# Patient Record
Sex: Male | Born: 1995 | Race: Black or African American | Hispanic: No | Marital: Single | Smoking: Current every day smoker
Health system: Southern US, Community
[De-identification: ages and names within clinical notes are randomized; demographics above are authoritative.]

## PROBLEM LIST (undated history)

## (undated) ENCOUNTER — Ambulatory Visit (HOSPITAL_COMMUNITY): Source: Home / Self Care

---

## 2018-05-07 ENCOUNTER — Encounter (HOSPITAL_COMMUNITY): Payer: Self-pay

## 2018-05-07 ENCOUNTER — Emergency Department (HOSPITAL_COMMUNITY): Payer: Medicaid Other

## 2018-05-07 ENCOUNTER — Emergency Department (HOSPITAL_COMMUNITY)
Admission: EM | Admit: 2018-05-07 | Discharge: 2018-05-07 | Disposition: A | Discharge status: Home / Self Care | Payer: Medicaid Other | Attending: Emergency Medicine | Admitting: Emergency Medicine

## 2018-05-07 DIAGNOSIS — M79602 Pain in left arm: Secondary | ICD-10-CM | POA: Diagnosis present

## 2018-05-07 DIAGNOSIS — F17210 Nicotine dependence, cigarettes, uncomplicated: Secondary | ICD-10-CM | POA: Insufficient documentation

## 2018-05-07 MED ORDER — ONDANSETRON 4 MG PO TBDP
4.0000 mg | ORAL_TABLET | Freq: Once | ORAL | Status: AC
  Administered 2018-05-07: 4 mg via ORAL
  Filled 2018-05-07: qty 1

## 2018-05-07 MED ORDER — OXYCODONE-ACETAMINOPHEN 5-325 MG PO TABS
1.0000 | ORAL_TABLET | Freq: Once | ORAL | Status: AC
  Administered 2018-05-07: 1 via ORAL
  Filled 2018-05-07: qty 1

## 2018-05-07 NOTE — ED Notes (Signed)
Patient is A&Ox4.  No signs of distress noted.  Please see providers complete history and physical exam.  

## 2018-05-07 NOTE — ED Notes (Signed)
PT states understanding of care given, follow up care, and medication prescribed. PT ambulated from ED to car with a steady gait. 
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
## 2018-05-07 NOTE — ED Notes (Signed)
No deformity noted on exam.
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
## 2019-04-12 ENCOUNTER — Other Ambulatory Visit: Payer: Self-pay | Admitting: *Deleted

## 2019-04-12 DIAGNOSIS — Z20822 Contact with and (suspected) exposure to covid-19: Secondary | ICD-10-CM

## 2019-04-13 LAB — NOVEL CORONAVIRUS, NAA: SARS-CoV-2, NAA: NOT DETECTED

## 2019-07-22 ENCOUNTER — Encounter (HOSPITAL_COMMUNITY): Payer: Self-pay

## 2019-07-22 ENCOUNTER — Emergency Department (HOSPITAL_COMMUNITY)
Admission: EM | Admit: 2019-07-22 | Discharge: 2019-07-22 | Discharge status: Against Medical Advice | Payer: Medicaid Other | Attending: Emergency Medicine | Admitting: Emergency Medicine

## 2019-07-22 ENCOUNTER — Other Ambulatory Visit: Payer: Self-pay

## 2019-07-22 DIAGNOSIS — R110 Nausea: Secondary | ICD-10-CM | POA: Diagnosis present

## 2019-07-22 DIAGNOSIS — Z5321 Procedure and treatment not carried out due to patient leaving prior to being seen by health care provider: Secondary | ICD-10-CM | POA: Diagnosis not present

## 2019-07-22 MED ORDER — ONDANSETRON 4 MG PO TBDP
4.0000 mg | ORAL_TABLET | Freq: Once | ORAL | Status: AC | PRN
  Administered 2019-07-22: 4 mg via ORAL
  Filled 2019-07-22: qty 1

## 2019-07-22 NOTE — ED Notes (Signed)
Pt now vomiting in triage ?

## 2019-07-22 NOTE — ED Triage Notes (Signed)
Pt reports cough and running nose for the pat 2 days. Pt also c.o nausea after taking an antibiotic for a UTI. Denies abd pain or vomiting.

## 2020-11-22 ENCOUNTER — Encounter (HOSPITAL_COMMUNITY): Payer: Self-pay

## 2020-11-22 ENCOUNTER — Emergency Department (HOSPITAL_COMMUNITY)
Admission: EM | Admit: 2020-11-22 | Discharge: 2020-11-22 | Disposition: A | Discharge status: Home / Self Care | Payer: Medicaid Other | Attending: Emergency Medicine | Admitting: Emergency Medicine

## 2020-11-22 DIAGNOSIS — H5789 Other specified disorders of eye and adnexa: Secondary | ICD-10-CM | POA: Diagnosis present

## 2020-11-22 DIAGNOSIS — F17200 Nicotine dependence, unspecified, uncomplicated: Secondary | ICD-10-CM | POA: Insufficient documentation

## 2020-11-22 DIAGNOSIS — H1032 Unspecified acute conjunctivitis, left eye: Secondary | ICD-10-CM | POA: Diagnosis not present

## 2020-11-22 MED ORDER — BACITRACIN-POLYMYXIN B 500-10000 UNIT/GM OP OINT: 1.0000 "application " | TOPICAL_OINTMENT | Freq: Two times a day (BID) | OPHTHALMIC | 0 refills | Status: DC

## 2020-11-22 MED ORDER — BACITRACIN-POLYMYXIN B 500-10000 UNIT/GM OP OINT: 1.0000 "application " | TOPICAL_OINTMENT | Freq: Two times a day (BID) | OPHTHALMIC | 0 refills | Status: AC

## 2020-11-22 NOTE — ED Provider Notes (Signed)
[facility]-EMERGENCY DEPT Provider Note   CSN: [id number] Arrival date & time: 11/22/20  1213     History Chief Complaint  Patient presents with  . Eye Problem    Devin Thompson is a 25 y.o. male with no PMH that presents to the ER for eye problem. States that has had L eye swelling for three days, with some redness in eye and pain in eye. No foreign body sensation. Denies any vision changes or pain with EOMS. No HA, fevers, neck pain, confusion, cough, URI symptoms. .States that he has discharge coming from L eye, especially when he wakes up. Daughter at home with same thing. Does not wear contacts.  HPI     History reviewed. No pertinent past medical history.  There are no problems to display for this patient.   History reviewed. No pertinent surgical history.     History reviewed. No pertinent family history.  Social History   Tobacco Use  . Smoking status: Current Every Day Smoker  . Smokeless tobacco: Never Used  Substance Use Topics  . Alcohol use: Never  . Drug use: Never    Home Medications Prior to Admission medications   Medication Sig Start Date End Date Taking? Authorizing Provider  bacitracin-polymyxin b (POLYSPORIN) ophthalmic ointment Place 1 application into the left eye every 12 (twelve) hours for 7 days. Apply ophthalmically 1 drop every 3 or 4 hours for 7 to 10 days. 11/22/20 11/29/20 Yes Farrel Gordon, PA-C    Allergies    Patient has no known allergies.  Review of Systems   Review of Systems  Constitutional: Negative for diaphoresis, fatigue and fever.  HENT: Negative for congestion, dental problem, drooling, ear discharge, ear pain, rhinorrhea, sinus pressure and sinus pain.   Eyes: Positive for pain, discharge and redness. Negative for photophobia, itching and visual disturbance.  Respiratory: Negative for shortness of breath.   Cardiovascular: Negative for chest pain.  Gastrointestinal: Negative for nausea and  vomiting.  Musculoskeletal: Negative for back pain and myalgias.  Skin: Negative for color change, pallor, rash and wound.  Neurological: Negative for syncope, weakness, light-headedness, numbness and headaches.  Psychiatric/Behavioral: Negative for behavioral problems and confusion.    Physical Exam Updated Vital Signs BP 115/90 (BP Location: Left Arm)   Pulse 61   Temp 98.2 F (36.8 C) (Oral)   Resp 17   Ht 5\' 5"  (1.651 m)   Wt 56.7 kg   SpO2 99%   BMI 20.80 kg/m   Physical Exam Constitutional:      General: He is not in acute distress.    Appearance: Normal appearance. He is not ill-appearing, toxic-appearing or diaphoretic.  Eyes:     General: Lids are normal. Vision grossly intact.     Extraocular Movements: Extraocular movements intact.     Right eye: Normal extraocular motion.     Left eye: Normal extraocular motion.     Conjunctiva/sclera:     Right eye: Right conjunctiva is not injected. No exudate.    Left eye: Left conjunctiva is injected. Exudate present.     Pupils: Pupils are equal, round, and reactive to light.     Comments: L eye with some conjunctival injection with crusty discharge. Upper eyelid swollen. Normal EOMS, PERRLA. Nonpainful EOMS. Normal visual acuity.  Cardiovascular:     Rate and Rhythm: Normal rate and regular rhythm.     Pulses: Normal pulses.  Pulmonary:     Effort: Pulmonary effort is normal.     Breath  sounds: Normal breath sounds.  Musculoskeletal:        General: Normal range of motion.  Skin:    General: Skin is warm and dry.     Capillary Refill: Capillary refill takes less than 2 seconds.  Neurological:     General: No focal deficit present.     Mental Status: He is alert and oriented to person, place, and time.  Psychiatric:        Mood and Affect: Mood normal.        Behavior: Behavior normal.        Thought Content: Thought content normal.     ED Results / Procedures / Treatments   Labs (all labs ordered are listed,  but only abnormal results are displayed) Labs Reviewed - No data to display  EKG None  Radiology No results found.  Procedures Procedures   Medications Ordered in ED Medications - No data to display  ED Course  I have reviewed the triage vital signs and the nursing notes.  Pertinent labs & imaging results that were available during my care of the patient were reviewed by me and considered in my medical decision making (see chart for details).    MDM Rules/Calculators/A&P                          Pt with conjunctivitis, normal visual acuity. Daughter with same. Will treat, symptomatic treatment discussed, if symptoms persist pt will follow up with optho. Doesn't wear contacts.   Doubt need for further emergent work up at this time. I explained the diagnosis and have given explicit precautions to return to the ER including for any other new or worsening symptoms. The patient understands and accepts the medical plan as it's been dictated and I have answered their questions. Discharge instructions concerning home care and prescriptions have been given. The patient is STABLE and is discharged to home in good condition.   Final Clinical Impression(s) / ED Diagnoses Final diagnoses:  Acute conjunctivitis of left eye, unspecified acute conjunctivitis type    Rx / DC Orders ED Discharge Orders         Ordered    bacitracin-polymyxin b (POLYSPORIN) ophthalmic ointment  Every 12 hours        11/22/20 1613           Farrel Gordon, PA-C 11/22/20 1621    Melene Plan, DO 11/22/20 1743

## 2020-11-22 NOTE — Discharge Instructions (Addendum)
  You were evaluated in the Emergency Department and after careful evaluation, we did not find any emergent condition requiring admission or further testing in the hospital.   Your exam/testing today was overall reassuring.  Symptoms seem to be due to  conjunctivitis. Take antibiotics, follow up with eye doctor if needed. Please return to the Emergency Department if you experience any worsening of your condition.  Thank you for allowing [place] to be a part of your care. Please speak to your pharmacist about any new medications prescribed today in regards to side effects or interactions with other medications.

## 2020-11-22 NOTE — ED Triage Notes (Signed)
Pt arrived via POV, c/o left sided eye swelling x3 days. States daughter had same issue, told it was allergies and given eye drops. Pt states he tried eye drops with no relief.

## 2020-11-22 NOTE — ED Notes (Signed)
Left: 20/25 Right: 20/30 Both: 20/20

## 2020-11-22 NOTE — ED Triage Notes (Signed)
Emergency Medicine Provider Triage Evaluation Note  Devin Thompson , a 25 y.o. male  was evaluated in triage.  Pt complains of L side eye swelling for 3 days, same thing with daughter at home. Used eye drops without relief. Some discharge from eye.,   Review of Systems  Positive: Eye pain, swelling  Negative: Vision changes   Physical Exam  BP 115/90 (BP Location: Left Arm)   Pulse 61   Temp 98.2 F (36.8 C) (Oral)   Resp 17   Ht 5\' 5"  (1.651 m)   Wt 56.7 kg   SpO2 99%   BMI 20.80 kg/m  Gen:   Awake, no distress   HEENT:  L eyelid swollen Resp:  Normal effort  Cardiac:  Normal rate  Abd:   Nondistended, nontender  MSK:   Moves extremities without difficulty Neuro:  Speech clear  Medical Decision Making  Medically screening exam initiated at 12:46 PM.  Appropriate orders placed.  Devin Thompson was informed that the remainder of the evaluation will be completed by another provider, this initial triage assessment does not replace that evaluation, and the importance of remaining in the ED until their evaluation is complete.  Clinical Impression  MSE was initiated and I personally evaluated the patient and placed orders (if any) at  12:47 PM on November 22, 2020.  The patient appears stable so that the remainder of the MSE may be completed by another provider.    November 24, 2020, PA-C 11/22/20 1247

## 2022-07-02 ENCOUNTER — Ambulatory Visit (HOSPITAL_COMMUNITY)
Admission: EM | Admit: 2022-07-02 | Discharge: 2022-07-02 | Disposition: A | Discharge status: Home / Self Care | Payer: Medicaid Other | Attending: Internal Medicine | Admitting: Internal Medicine

## 2022-07-02 ENCOUNTER — Encounter (HOSPITAL_COMMUNITY): Payer: Self-pay

## 2022-07-02 DIAGNOSIS — F17210 Nicotine dependence, cigarettes, uncomplicated: Secondary | ICD-10-CM | POA: Diagnosis not present

## 2022-07-02 DIAGNOSIS — R059 Cough, unspecified: Secondary | ICD-10-CM | POA: Diagnosis present

## 2022-07-02 DIAGNOSIS — Z28310 Unvaccinated for covid-19: Secondary | ICD-10-CM | POA: Insufficient documentation

## 2022-07-02 DIAGNOSIS — Z20822 Contact with and (suspected) exposure to covid-19: Secondary | ICD-10-CM | POA: Diagnosis not present

## 2022-07-02 DIAGNOSIS — J069 Acute upper respiratory infection, unspecified: Secondary | ICD-10-CM

## 2022-07-02 MED ORDER — IBUPROFEN 800 MG PO TABS
800.0000 mg | ORAL_TABLET | Freq: Once | ORAL | Status: AC
  Administered 2022-07-02: 800 mg via ORAL

## 2022-07-02 MED ORDER — IBUPROFEN 800 MG PO TABS
ORAL_TABLET | ORAL | Status: AC
  Filled 2022-07-02: qty 1

## 2022-07-02 MED ORDER — PROMETHAZINE-DM 6.25-15 MG/5ML PO SYRP: 5.0000 mL | ORAL_SOLUTION | Freq: Every evening | ORAL | 0 refills | Status: DC | PRN

## 2022-07-02 MED ORDER — BENZONATATE 100 MG PO CAPS: 100.0000 mg | ORAL_CAPSULE | Freq: Three times a day (TID) | ORAL | 0 refills | Status: DC

## 2022-07-02 NOTE — Discharge Instructions (Signed)
You have a viral upper respiratory infection.  COVID-19 testing is pending. We will call you with results if positive. If your COVID test is positive, you must stay at home until day 6 of symptoms. On day 6, you may go out into public and go back to work, but you must wear a mask until day 11 of symptoms to prevent spread to others.  Purchase mucinex (guaifenesin) 1200mg and take this every 12 hours for the next few days to thin your nasal congestion and mucous so that you are able to get out of your body easier by coughing and blowing your nose. Drink plenty of water while taking this.  Take tessalon pearles every 8 hours as needed for cough.  Take Promethazine DM cough medication to help with your cough at nighttime so that you are able to sleep. Do not drive, drink alcohol, or go to work while taking this medication since it can make you sleepy. Only take this at nighttime.   You may take tylenol 1,000mg and ibuprofen 600mg every 6 hours with food as needed for fever/chills, sore throat, aches/pains, and inflammation associated with viral illness. Take this with food to avoid stomach upset.    You may do salt water and baking soda gargles every 4 hours as needed for your throat pain.  Please put 1 teaspoon of salt and 1/2 teaspoon of baking soda in 8 ounces of warm water then gargle and spit the water out. You may also put 1 tablespoon of honey in warm water and drink this to soothe your throat.  Place a humidifier in your room at night to help decrease dry air that can irritate your airway and cause you to have a sore throat and cough.  Please try to eat a well-balanced diet while you are sick so that your body gets proper nutrition to heal.  If you develop any new or worsening symptoms, please return.  If your symptoms are severe, please go to the emergency room.  Follow-up with your primary care provider for further evaluation and management of your symptoms as well as ongoing wellness visits.  I  hope you feel better!   

## 2022-07-02 NOTE — ED Provider Notes (Signed)
[facility]    CSN: [id number] Arrival date & time: 07/02/22  1241      History   Chief Complaint Chief Complaint  Patient presents with   URI    HPI Devin Thompson is a 26 y.o. male.   Patient presents urgent care for evaluation of cough, fever/chills, sore throat, and nasal congestion that starte 4 days ago on Friday, June 28, 2022.  States his whole household is sick including his wife and his 2 children.  He shares a room with his wife and 2 children and believes that they all have the same illness.  Symptoms initially began with some nausea and vomiting but he has not experienced nausea or vomiting since symptom onset.  Denies vision changes, headache, body aches, abdominal pain, shortness of breath, chest pain, weakness, dizziness, wheezing, history of chronic respiratory problems, and lightheadedness.  He is not vaccinated against COVID-19 or influenza.  He is a current every day cigarette and marijuana smoker.  He also vapes but denies other drug use.  He has been using over-the-counter combo cold and flu medications without very much relief of symptoms.  He has not taken his temperature at home but states he has felt hot and cold over the last few days.  Last dose of Tylenol or ibuprofen was yesterday.  He is currently afebrile.    URI   History reviewed. No pertinent past medical history.  There are no problems to display for this patient.   History reviewed. No pertinent surgical history.     Home Medications    Prior to Admission medications   Medication Sig Start Date End Date Taking? Authorizing Provider  benzonatate (TESSALON) 100 MG capsule Take 1 capsule (100 mg total) by mouth every 8 (eight) hours. 07/02/22  Yes Carlisle Beers, FNP  promethazine-dextromethorphan (PROMETHAZINE-DM) 6.25-15 MG/5ML syrup Take 5 mLs by mouth at bedtime as needed for cough. 07/02/22  Yes Carlisle Beers, FNP    Family History History reviewed. No  pertinent family history.  Social History Social History   Tobacco Use   Smoking status: Every Day   Smokeless tobacco: Never  Substance Use Topics   Alcohol use: Never   Drug use: Never     Allergies   Patient has no known allergies.   Review of Systems Review of Systems Per HPI  Physical Exam Triage Vital Signs ED Triage Vitals  Enc Vitals Group     BP 07/02/22 1341 120/84     Pulse Rate 07/02/22 1341 63     Resp 07/02/22 1341 16     Temp 07/02/22 1341 98.8 F (37.1 C)     Temp Source 07/02/22 1341 Oral     SpO2 07/02/22 1341 98 %     Weight --      Height --      Head Circumference --      Peak Flow --      Pain Score 07/02/22 1340 4     Pain Loc --      Pain Edu? --      Excl. in GC? --    No data found.  Updated Vital Signs BP 120/84 (BP Location: Right Arm)   Pulse 63   Temp 98.8 F (37.1 C) (Oral)   Resp 16   SpO2 98%   Visual Acuity Right Eye Distance:   Left Eye Distance:   Bilateral Distance:    Right Eye Near:   Left Eye Near:    Bilateral  Near:     Physical Exam Vitals and nursing note reviewed.  Constitutional:      Appearance: He is not ill-appearing or toxic-appearing.  HENT:     Head: Normocephalic and atraumatic.     Right Ear: Hearing, tympanic membrane, ear canal and external ear normal.     Left Ear: Hearing, tympanic membrane, ear canal and external ear normal.     Nose: Congestion present.     Mouth/Throat:     Lips: Pink.     Mouth: Mucous membranes are moist.     Pharynx: Posterior oropharyngeal erythema present.     Comments: Small amount of clear postnasal drainage visualized to the posterior oropharynx.  Eyes:     General: Lids are normal. Vision grossly intact. Gaze aligned appropriately.        Right eye: No discharge.        Left eye: No discharge.     Extraocular Movements: Extraocular movements intact.     Conjunctiva/sclera: Conjunctivae normal.     Pupils: Pupils are equal, round, and reactive to light.   Cardiovascular:     Rate and Rhythm: Normal rate and regular rhythm.     Heart sounds: Normal heart sounds, S1 normal and S2 normal.  Pulmonary:     Effort: Pulmonary effort is normal. No respiratory distress.     Breath sounds: Normal breath sounds and air entry.  Abdominal:     General: Abdomen is flat. Bowel sounds are normal.     Palpations: Abdomen is soft.  Musculoskeletal:     Cervical back: Neck supple.  Lymphadenopathy:     Cervical: No cervical adenopathy.  Skin:    General: Skin is warm and dry.     Capillary Refill: Capillary refill takes less than 2 seconds.     Findings: No rash.  Neurological:     General: No focal deficit present.     Mental Status: He is alert and oriented to person, place, and time. Mental status is at baseline.     Cranial Nerves: No dysarthria or facial asymmetry.     Motor: No weakness.     Gait: Gait normal.  Psychiatric:        Mood and Affect: Mood normal.        Speech: Speech normal.        Behavior: Behavior normal.        Thought Content: Thought content normal.        Judgment: Judgment normal.      UC Treatments / Results  Labs (all labs ordered are listed, but only abnormal results are displayed) Labs Reviewed  SARS CORONAVIRUS 2 (TAT 6-24 HRS)    EKG   Radiology No results found.  Procedures Procedures (including critical care time)  Medications Ordered in UC Medications  ibuprofen (ADVIL) tablet 800 mg (800 mg Oral Given 07/02/22 1456)    Initial Impression / Assessment and Plan / UC Course  I have reviewed the triage vital signs and the nursing notes.  Pertinent labs & imaging results that were available during my care of the patient were reviewed by me and considered in my medical decision making (see chart for details).   Viral URI with cough Symptoms and physical exam consistent with a viral upper respiratory tract infection that will likely resolve with rest, fluids, and prescriptions for symptomatic  relief. No indication for imaging today based on stable cardiopulmonary exam and hemodynamically stable vital signs. COVID-19 testing per patient request is pending.  We will call patient if this is positive.  Quarantine guidelines discussed. Currently on day 5 of symptoms.   Patient given ibuprofen 800mg  in clinic today for sore throat.  Tessalon pearles every 8 hours as needed for cough during the day and promethazine DM sent to pharmacy for symptomatic relief to be taken as prescribed.   Promethazine DM cough medication may be used as needed only at bedtime due to possible drowsiness side effect (no alcohol, working, or driving while taking this advised).   May purchase guaifenesin and use this every 12 hours as needed for nasal congestion and cough.  May use ibuprofen/tylenol over the counter for body aches, fever/chills, and overall discomfort associated with viral illness. Nonpharmacologic interventions for symptom relief provided and after visit summary below.   Strict ED/urgent care return precautions given.  Patient verbalizes understanding and agreement with plan.  Counseled patient regarding possible side effects and uses of all medications prescribed at today's visit.  Patient verbalizes understanding and agreement with plan.  All questions answered.  Patient discharged from urgent care in stable condition.        Final Clinical Impressions(s) / UC Diagnoses   Final diagnoses:  Viral URI with cough     Discharge Instructions      You have a viral upper respiratory infection.  COVID-19 testing is pending. We will call you with results if positive. If your COVID test is positive, you must stay at home until day 6 of symptoms. On day 6, you may go out into public and go back to work, but you must wear a mask until day 11 of symptoms to prevent spread to others.  Purchase mucinex (guaifenesin) 1200mg  and take this every 12 hours for the next few days to thin your nasal  congestion and mucous so that you are able to get out of your body easier by coughing and blowing your nose. Drink plenty of water while taking this.  Take tessalon pearles every 8 hours as needed for cough.  Take Promethazine DM cough medication to help with your cough at nighttime so that you are able to sleep. Do not drive, drink alcohol, or go to work while taking this medication since it can make you sleepy. Only take this at nighttime.   You may take tylenol 1,000mg  and ibuprofen 600mg  every 6 hours with food as needed for fever/chills, sore throat, aches/pains, and inflammation associated with viral illness. Take this with food to avoid stomach upset.    You may do salt water and baking soda gargles every 4 hours as needed for your throat pain.  Please put 1 teaspoon of salt and 1/2 teaspoon of baking soda in 8 ounces of warm water then gargle and spit the water out. You may also put 1 tablespoon of honey in warm water and drink this to soothe your throat.  Place a humidifier in your room at night to help decrease dry air that can irritate your airway and cause you to have a sore throat and cough.  Please try to eat a well-balanced diet while you are sick so that your body gets proper nutrition to heal.  If you develop any new or worsening symptoms, please return.  If your symptoms are severe, please go to the emergency room.  Follow-up with your primary care provider for further evaluation and management of your symptoms as well as ongoing wellness visits.  I hope you feel better!      ED Prescriptions  Medication Sig Dispense Auth. Provider   benzonatate (TESSALON) 100 MG capsule Take 1 capsule (100 mg total) by mouth every 8 (eight) hours. 21 capsule Carlisle Beers, FNP   promethazine-dextromethorphan (PROMETHAZINE-DM) 6.25-15 MG/5ML syrup Take 5 mLs by mouth at bedtime as needed for cough. 118 mL Carlisle Beers, FNP      PDMP not reviewed this encounter.    Carlisle Beers, FNP 07/02/22 1500

## 2022-07-02 NOTE — ED Triage Notes (Signed)
Chief Complaint: Cough; Headache; Runny Nose; Testing Covid/Flu   Onset: Friday  OTC medications tried: Yes- cough and cold meds     with little relief  Sick exposure: Yes- household is sick but no testing.   New foods or medications: No  Recent Travel: No

## 2022-07-03 LAB — SARS CORONAVIRUS 2 (TAT 6-24 HRS): SARS Coronavirus 2: NEGATIVE

## 2023-10-29 ENCOUNTER — Ambulatory Visit (HOSPITAL_COMMUNITY)
Admission: EM | Admit: 2023-10-29 | Discharge: 2023-10-29 | Disposition: A | Discharge status: Home / Self Care | Attending: Emergency Medicine | Admitting: Emergency Medicine

## 2023-10-29 ENCOUNTER — Encounter (HOSPITAL_COMMUNITY): Payer: Self-pay

## 2023-10-29 DIAGNOSIS — K0889 Other specified disorders of teeth and supporting structures: Secondary | ICD-10-CM | POA: Diagnosis not present

## 2023-10-29 MED ORDER — AMOXICILLIN-POT CLAVULANATE 875-125 MG PO TABS: 1.0000 | ORAL_TABLET | Freq: Two times a day (BID) | ORAL | 0 refills | Status: DC

## 2023-10-29 MED ORDER — IBUPROFEN 800 MG PO TABS: 800.0000 mg | ORAL_TABLET | Freq: Three times a day (TID) | ORAL | 0 refills | Status: DC

## 2023-10-29 NOTE — ED Triage Notes (Signed)
 Dental pain and swelling x 3 days.  Home Intervention: Tylenol.

## 2023-10-29 NOTE — ED Provider Notes (Signed)
 [facility]    CSN: [id number] Arrival date & time: 10/29/23  1702      History   Chief Complaint No chief complaint on file.   HPI Devin Thompson is a 28 y.o. male.   Patient presents to clinic over concern of right lower dental pain in his back molar that has been ongoing and worsening over the past week.  Rates pain at 100 out of 10.  Has been taking Tylenol, last took 500 mg earlier this morning.  He did break off a chunk of his molar a few months back but has not had any issues.  Does not currently have a dentist.  Tooth is sensitive to hot and cold. Denies fevers.   The history is provided by the patient and medical records.    History reviewed. No pertinent past medical history.  There are no active problems to display for this patient.   History reviewed. No pertinent surgical history.     Home Medications    Prior to Admission medications   Medication Sig Start Date End Date Taking? Authorizing Provider  amoxicillin-clavulanate (AUGMENTIN) 875-125 MG tablet Take 1 tablet by mouth every 12 (twelve) hours. 10/29/23  Yes Rinaldo Ratel, [place] N, FNP  ibuprofen (ADVIL) 800 MG tablet Take 1 tablet (800 mg total) by mouth 3 (three) times daily. 10/29/23  Yes Rinaldo Ratel, [place] N, FNP  benzonatate (TESSALON) 100 MG capsule Take 1 capsule (100 mg total) by mouth every 8 (eight) hours. 07/02/22   Carlisle Beers, FNP  promethazine-dextromethorphan (PROMETHAZINE-DM) 6.25-15 MG/5ML syrup Take 5 mLs by mouth at bedtime as needed for cough. 07/02/22   Carlisle Beers, FNP    Family History History reviewed. No pertinent family history.  Social History Social History   Tobacco Use   Smoking status: Every Day   Smokeless tobacco: Never  Substance Use Topics   Alcohol use: Never   Drug use: Never     Allergies   Patient has no known allergies.   Review of Systems Review of Systems  Per HPI  Physical Exam Triage Vital Signs ED Triage Vitals  [10/29/23 1855]  Encounter Vitals Group     BP 122/83     Systolic BP Percentile      Diastolic BP Percentile      Pulse Rate (!) 46     Resp 18     Temp 98.1 F (36.7 C)     Temp Source Oral     SpO2 98 %     Weight      Height      Head Circumference      Peak Flow      Pain Score      Pain Loc      Pain Education      Exclude from Growth Chart    No data found.  Updated Vital Signs BP 122/83 (BP Location: Left Arm)   Pulse (!) 46   Temp 98.1 F (36.7 C) (Oral)   Resp 18   SpO2 98%   Visual Acuity Right Eye Distance:   Left Eye Distance:   Bilateral Distance:    Right Eye Near:   Left Eye Near:    Bilateral Near:     Physical Exam Vitals and nursing note reviewed.  Constitutional:      Appearance: Normal appearance.  HENT:     Head: Normocephalic and atraumatic.     Right Ear: External ear normal.     Left Ear: External ear  normal.     Nose: Nose normal.     Mouth/Throat:     Mouth: Mucous membranes are moist.   Eyes:     Conjunctiva/sclera: Conjunctivae normal.  Cardiovascular:     Rate and Rhythm: Normal rate.  Pulmonary:     Effort: Pulmonary effort is normal. No respiratory distress.  Lymphadenopathy:     Cervical: No cervical adenopathy.  Skin:    General: Skin is warm and dry.  Neurological:     General: No focal deficit present.     Mental Status: He is alert.  Psychiatric:        Mood and Affect: Mood normal.        Behavior: Behavior is cooperative.      UC Treatments / Results  Labs (all labs ordered are listed, but only abnormal results are displayed) Labs Reviewed - No data to display  EKG   Radiology No results found.  Procedures Procedures (including critical care time)  Medications Ordered in UC Medications - No data to display  Initial Impression / Assessment and Plan / UC Course  I have reviewed the triage vital signs and the nursing notes.  Pertinent labs & imaging results that were available during my care  of the patient were reviewed by me and considered in my medical decision making (see chart for details).  Vitals and triage reviewed, patient is hemodynamically stable.  Broken tooth #32 that is the cause of the pain.  Will cover with Augmentin over concerns of bacterial infection.  Offered IM Toradol in clinic for severity of pain, patient declined and opted for oral ibuprofen.  Provided with local dental resources.  Plan of care, follow-up care return precautions given, no questions at this time.  Work note provided.      Final Clinical Impressions(s) / UC Diagnoses   Final diagnoses:  Pain, dental     Discharge Instructions      Take the antibiotics twice daily as prescribed and until finished.  You can take the 800 mg ibuprofen every 8 hours.  If you have any breakthrough pain you can take 500 mg of Tylenol.  Follow-up with a dentist or oral surgeon tomorrow.  Avoid any hot or cold foods or chewing on that side with the open exposed tooth.  [facility] [place] Address: [address] [place], [place], [place] [address] Phone: [phone]  [facility] [phone] extension [phone] [address]  Dr. Lawrence Marseilles [phone] [address].  [place] [phone] [facility] [place].  [facility] [phone] extension [phone] [place], [place], [address] First come first serve for the first 10 clients.  May do simple extractions only, no wisdom teeth or surgery.  You may try the second for Thursday of the month starting at 6:30 AM.  [facility] You may call the school to see if they are still helping to provide dental care for emergent cases.     ED Prescriptions     Medication Sig Dispense Auth. Provider   ibuprofen (ADVIL) 800 MG tablet Take 1 tablet (800 mg total) by mouth 3 (three) times daily. 21 tablet Rinaldo Ratel, [place] N, [place]   amoxicillin-clavulanate (AUGMENTIN) 875-125 MG tablet  Take 1 tablet by mouth every 12 (twelve) hours. 14 tablet Torrez Renfroe, [place], [place]      PDMP not reviewed this encounter.   Yudith Norlander, [place] N, [place] 10/29/23 [phone]

## 2023-10-29 NOTE — Discharge Instructions (Signed)
 Take the antibiotics twice daily as prescribed and until finished.  You can take the 800 mg ibuprofen every 8 hours.  If you have any breakthrough pain you can take 500 mg of Tylenol.  Follow-up with a dentist or oral surgeon tomorrow.  Avoid any hot or cold foods or chewing on that side with the open exposed tooth.  [facility] in [place], [place] Address: [address] [place], [place], [place] [address] Phone: [phone]  [facility] [phone] extension [phone] [address]  Dr. Lawrence Marseilles [phone] [address]  [facility] [phone] [address].  [facility] [phone] extension [phone], [place], [place], [address] First come first serve for the first 10 clients.  May do simple extractions only, no wisdom teeth or surgery.  You may try the second for Thursday of the month starting at 6:30 AM.  [facility] You may call the school to see if they are still helping to provide dental care for emergent cases.

## 2023-10-30 ENCOUNTER — Telehealth (HOSPITAL_COMMUNITY): Payer: Self-pay

## 2023-10-30 NOTE — Telephone Encounter (Signed)
 Informed by front desk staff: "patient was seen last night states medication is not at pharmacy [phone]."   Called the pharmacy and they state they did receive the medications and they are now ready for pick up.   Patient informed and verbalized understanding that medications are ready for pick up at [facility] on the [address] of [address].

## 2024-05-18 ENCOUNTER — Other Ambulatory Visit: Payer: Self-pay

## 2024-05-18 ENCOUNTER — Emergency Department (HOSPITAL_COMMUNITY)
Admission: EM | Admit: 2024-05-18 | Discharge: 2024-05-18 | Discharge status: Against Medical Advice | Attending: Emergency Medicine | Admitting: Emergency Medicine

## 2024-05-18 DIAGNOSIS — K529 Noninfective gastroenteritis and colitis, unspecified: Secondary | ICD-10-CM | POA: Insufficient documentation

## 2024-05-18 DIAGNOSIS — R112 Nausea with vomiting, unspecified: Secondary | ICD-10-CM | POA: Diagnosis present

## 2024-05-18 LAB — COMPREHENSIVE METABOLIC PANEL WITH GFR
ALT: 22 U/L (ref 0–44)
AST: 26 U/L (ref 15–41)
Albumin: 3.9 g/dL (ref 3.5–5.0)
Alkaline Phosphatase: 49 U/L (ref 38–126)
Anion gap: 8 (ref 5–15)
BUN: 11 mg/dL (ref 6–20)
CO2: 22 mmol/L (ref 22–32)
Calcium: 8.9 mg/dL (ref 8.9–10.3)
Chloride: 109 mmol/L (ref 98–111)
Creatinine, Ser: 1.01 mg/dL (ref 0.61–1.24)
GFR, Estimated: >60 mL/min (ref >60)
Glucose, Bld: 102 mg/dL — ABNORMAL HIGH (ref 70–99)
Potassium: 4 mmol/L (ref 3.5–5.1)
Sodium: 139 mmol/L (ref 135–145)
Total Bilirubin: 1 mg/dL (ref 0.0–1.2)
Total Protein: 6.5 g/dL (ref 6.5–8.1)

## 2024-05-18 LAB — URINALYSIS, ROUTINE W REFLEX MICROSCOPIC
Bilirubin Urine: NEGATIVE
Glucose, UA: NEGATIVE mg/dL
Hgb urine dipstick: NEGATIVE
Ketones, ur: 5 mg/dL — AB
Leukocytes,Ua: NEGATIVE
Nitrite: NEGATIVE
Protein, ur: NEGATIVE mg/dL
Specific Gravity, Urine: 1.018 (ref 1.005–1.030)
pH: 8 (ref 5.0–8.0)

## 2024-05-18 LAB — CBC
HCT: 43.9 % (ref 39.0–52.0)
Hemoglobin: 15.3 g/dL (ref 13.0–17.0)
MCH: 30.2 pg (ref 26.0–34.0)
MCHC: 34.9 g/dL (ref 30.0–36.0)
MCV: 86.6 fL (ref 80.0–100.0)
Platelets: 226 K/uL (ref 150–400)
RBC: 5.07 MIL/uL (ref 4.22–5.81)
RDW: 13.2 % (ref 11.5–15.5)
WBC: 10.2 K/uL (ref 4.0–10.5)
nRBC: 0 % (ref 0.0–0.2)

## 2024-05-18 LAB — RESP PANEL BY RT-PCR (RSV, FLU A&B, COVID)  RVPGX2
Influenza A by PCR: NEGATIVE
Influenza B by PCR: NEGATIVE
Resp Syncytial Virus by PCR: NEGATIVE
SARS Coronavirus 2 by RT PCR: NEGATIVE

## 2024-05-18 LAB — LIPASE, BLOOD: Lipase: 31 U/L (ref 11–51)

## 2024-05-18 MED ORDER — ONDANSETRON 4 MG PO TBDP
4.0000 mg | ORAL_TABLET | Freq: Once | ORAL | Status: AC
  Administered 2024-05-18: 4 mg via ORAL
  Filled 2024-05-18: qty 1

## 2024-05-18 MED ORDER — ONDANSETRON 4 MG PO TBDP: 4.0000 mg | ORAL_TABLET | Freq: Three times a day (TID) | ORAL | 0 refills | Status: DC | PRN

## 2024-05-18 NOTE — ED Notes (Signed)
 Pt stated he had to leave. I advised him he could always come back or use other resources if he chose too

## 2024-05-18 NOTE — ED Triage Notes (Signed)
 Pt. Stated, Ive had N/V/D since 0300 this morning and I can't stop.

## 2024-05-18 NOTE — ED Provider Notes (Signed)
 [place] EMERGENCY DEPARTMENT AT [facility] Provider Note   CSN: [id number] Arrival date & time: 05/18/24  9056     Patient presents with: Emesis, Nausea, Diarrhea, and Abdominal Pain   Devin Thompson is a 28 y.o. male.    Emesis Associated symptoms: abdominal pain and diarrhea   Diarrhea Associated symptoms: abdominal pain and vomiting   Abdominal Pain Associated symptoms: diarrhea, nausea and vomiting      28 year old male presenting to the emergency department with nausea, vomiting, diarrhea that has been ongoing for the past several hours.  He woke up at around 3 AM with uncontrolled nausea and vomiting, has vomited around 5 times, described as NBNB, also had around 5 episodes of nonbloody diarrhea.  He presented to the emergency department due to inability to keep anything down.  He denies any abdominal pain at this time.  No genitourinary symptoms.  All of his children have symptoms of viral upper respiratory infection, do not have any symptoms of gastroenteritis.  He denies any fevers or chills.  He is now requesting something to drink after being administered Zofran .  Prior to Admission medications   Medication Sig Start Date End Date Taking? Authorizing Provider  ondansetron  (ZOFRAN -ODT) 4 MG disintegrating tablet Take 1 tablet (4 mg total) by mouth every 8 (eight) hours as needed for nausea or vomiting. 05/18/24  Yes Jerrol Agent, MD  amoxicillin -clavulanate (AUGMENTIN ) 875-125 MG tablet Take 1 tablet by mouth every 12 (twelve) hours. 10/29/23   Dreama, [place]  N, FNP  benzonatate  (TESSALON ) 100 MG capsule Take 1 capsule (100 mg total) by mouth every 8 (eight) hours. 07/02/22   Enedelia Dorna HERO, FNP  ibuprofen  (ADVIL ) 800 MG tablet Take 1 tablet (800 mg total) by mouth 3 (three) times daily. 10/29/23   Dreama, [place]  N, FNP  promethazine -dextromethorphan (PROMETHAZINE -DM) 6.25-15 MG/5ML syrup Take 5 mLs by mouth at bedtime as needed for cough. 07/02/22    Enedelia Dorna HERO, FNP    Allergies: Patient has no known allergies.    Review of Systems  Gastrointestinal:  Positive for abdominal pain, diarrhea, nausea and vomiting.  All other systems reviewed and are negative.   Updated Vital Signs BP 122/80 (BP Location: Right Arm)   Pulse 71   Temp 99 F (37.2 C) (Oral)   Resp 17   Ht 5' 5 (1.651 m)   Wt 66.2 kg   SpO2 99%   BMI 24.30 kg/m   Physical Exam Vitals and nursing note reviewed.  Constitutional:      General: He is not in acute distress.    Appearance: He is well-developed.  HENT:     Head: Normocephalic and atraumatic.  Eyes:     Conjunctiva/sclera: Conjunctivae normal.  Cardiovascular:     Rate and Rhythm: Normal rate and regular rhythm.     Heart sounds: No murmur heard. Pulmonary:     Effort: Pulmonary effort is normal. No respiratory distress.     Breath sounds: Normal breath sounds.  Abdominal:     Palpations: Abdomen is soft.     Tenderness: There is no abdominal tenderness.     Comments: Abdomen is soft, nontender, nondistended, no rebound or guarding  Musculoskeletal:        General: No swelling.     Cervical back: Neck supple.  Skin:    General: Skin is warm and dry.     Capillary Refill: Capillary refill takes less than 2 seconds.  Neurological:     Mental Status: He is alert.  Psychiatric:        Mood and Affect: Mood normal.     (all labs ordered are listed, but only abnormal results are displayed) Labs Reviewed  COMPREHENSIVE METABOLIC PANEL WITH GFR - Abnormal; Notable for the following components:      Result Value   Glucose, Bld 102 (*)    All other components within normal limits  URINALYSIS, ROUTINE W REFLEX MICROSCOPIC - Abnormal; Notable for the following components:   Ketones, ur 5 (*)    All other components within normal limits  RESP PANEL BY RT-PCR (RSV, FLU A&B, COVID)  RVPGX2  LIPASE, BLOOD  CBC    EKG: None  Radiology: No results found.   Procedures    Medications Ordered in the ED  ondansetron  (ZOFRAN -ODT) disintegrating tablet 4 mg (4 mg Oral Given 05/18/24 1004)                                    Medical Decision Making Amount and/or Complexity of Data Reviewed Labs: ordered.  Risk Prescription drug management.    28 year old male presenting to the emergency department with nausea, vomiting, diarrhea that has been ongoing for the past several hours.  He woke up at around 3 AM with uncontrolled nausea and vomiting, has vomited around 5 times, described as NBNB, also had around 5 episodes of nonbloody diarrhea.  He presented to the emergency department due to inability to keep anything down.  He denies any abdominal pain at this time.  No genitourinary symptoms.  All of his children have symptoms of viral upper respiratory infection, do not have any symptoms of gastroenteritis.  He denies any fevers or chills.  He is now requesting something to drink after being administered Zofran .  On arrival, the patient was afebrile, temperature 99, not tachycardic or tachypneic, BP 122/80, saturating 98% on room air.  On exam the patient was overall well-hydrated, moist mucous membranes, in no distress, lungs CTAB, abdomen soft, nontender, nondistended, no rebound or guarding.  Patient presenting with symptoms of likely viral gastroenteritis.  Feeling symptomatically improved after oral Zofran  and requesting something to drink.  Laboratory evaluation obtained and unremarkable.   Pt eloped following initial evaluation.     Final diagnoses:  Gastroenteritis    ED Discharge Orders          Ordered    ondansetron  (ZOFRAN -ODT) 4 MG disintegrating tablet  Every 8 hours PRN        05/18/24 1102               Jerrol Agent, MD 05/18/24 1251

## 2024-07-05 ENCOUNTER — Emergency Department (HOSPITAL_COMMUNITY)
Admission: EM | Admit: 2024-07-05 | Discharge: 2024-07-05 | Disposition: A | Discharge status: Home / Self Care | Attending: Emergency Medicine | Admitting: Emergency Medicine

## 2024-07-05 ENCOUNTER — Other Ambulatory Visit: Payer: Self-pay

## 2024-07-05 ENCOUNTER — Encounter (HOSPITAL_COMMUNITY): Payer: Self-pay | Admitting: *Deleted

## 2024-07-05 DIAGNOSIS — H9201 Otalgia, right ear: Secondary | ICD-10-CM

## 2024-07-05 MED ORDER — AMOXICILLIN 500 MG PO CAPS: 500.0000 mg | ORAL_CAPSULE | Freq: Three times a day (TID) | ORAL | 0 refills | Status: AC

## 2024-07-05 MED ORDER — HYDROCODONE-ACETAMINOPHEN 5-325 MG PO TABS
1.0000 | ORAL_TABLET | Freq: Once | ORAL | Status: AC
  Administered 2024-07-05: 1 via ORAL
  Filled 2024-07-05: qty 1

## 2024-07-05 MED ORDER — AMOXICILLIN 500 MG PO CAPS
500.0000 mg | ORAL_CAPSULE | Freq: Once | ORAL | Status: AC
  Administered 2024-07-05: 500 mg via ORAL
  Filled 2024-07-05: qty 1

## 2024-07-05 NOTE — ED Provider Notes (Signed)
  [place] [facility] AT [facility] Provider Note   CSN: [id number] Arrival date & time: 07/05/24  9680     Patient presents with: Otalgia   Devin Thompson is a 28 y.o. male.   The history is provided by the patient and the spouse.  Otalgia Location:  Right Quality:  Aching Severity:  Severe Onset quality:  Sudden Timing:  Constant Progression:  Worsening Chronicity:  New Context: not direct blow   Relieved by:  Nothing Associated symptoms: no ear discharge, no fever, no hearing loss and no tinnitus    Patient reports has had recent cough and congestion.  Tonight he woke up with severe right ear pain.  No trauma.  No drainage from the ear.  He has mild muffled hearing    Prior to Admission medications   Medication Sig Start Date End Date Taking? Authorizing Provider  amoxicillin  (AMOXIL ) 500 MG capsule Take 1 capsule (500 mg total) by mouth 3 (three) times daily. 07/05/24  Yes Midge Golas, MD    Allergies: Patient has no known allergies.    Review of Systems  Constitutional:  Negative for fever.  HENT:  Positive for ear pain. Negative for ear discharge, hearing loss and tinnitus.     Updated Vital Signs BP 123/64 (BP Location: Right Arm)   Pulse 93   Temp 98.9 F (37.2 C) (Oral)   Resp 16   Ht 1.651 m (5' 5)   Wt 65.3 kg   SpO2 95%   BMI 23.96 kg/m   Physical Exam CONSTITUTIONAL: Well developed/well nourished HEAD: Normocephalic/atraumatic EYES: EOMI/PERRL ENMT: Mucous membranes moist Ears are symmetric.  Left TM clear and intact. Right TM is erythematous and bulging, it is intact No mastoid tenderness Uvula midline, no erythema or exudates, no stridor NECK: supple no meningeal signs NEURO: Pt is awake/alert/appropriate, moves all extremitiesx4.  No facial droop.   SKIN: warm, color normal  (all labs ordered are listed, but only abnormal results are displayed) Labs Reviewed - No data to  display  EKG: None  Radiology: No results found.   Procedures   Medications Ordered in the ED  amoxicillin  (AMOXIL ) capsule 500 mg (has no administration in time range)  HYDROcodone -acetaminophen  (NORCO/VICODIN) 5-325 MG per tablet 1 tablet (has no administration in time range)                                    Medical Decision Making Risk Prescription drug management.   Patient well-appearing.  Will treat for otitis media.  He is safe for outpatient management     Final diagnoses:  Right ear pain    ED Discharge Orders          Ordered    amoxicillin  (AMOXIL ) 500 MG capsule  3 times daily        07/05/24 0504               Midge Golas, MD 07/05/24 [phone]

## 2024-07-05 NOTE — ED Triage Notes (Signed)
 States he woke up with severe pain right ear
